# Patient Record
Sex: Male | Born: 2005 | Race: White | Hispanic: No | Marital: Single | Smoking: Former smoker
Health system: Southern US, Community
[De-identification: ages and names within clinical notes are randomized; demographics above are authoritative.]

## PROBLEM LIST (undated history)

## (undated) DIAGNOSIS — J210 Acute bronchiolitis due to respiratory syncytial virus: Secondary | ICD-10-CM

---
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
## 2023-11-11 ENCOUNTER — Emergency Department (HOSPITAL_BASED_OUTPATIENT_CLINIC_OR_DEPARTMENT_OTHER): Payer: 59 | Admitting: Radiology

## 2023-11-11 ENCOUNTER — Other Ambulatory Visit: Payer: Self-pay

## 2023-11-11 ENCOUNTER — Encounter (HOSPITAL_COMMUNITY): Payer: Self-pay

## 2023-11-11 ENCOUNTER — Encounter (HOSPITAL_BASED_OUTPATIENT_CLINIC_OR_DEPARTMENT_OTHER): Payer: Self-pay | Admitting: *Deleted

## 2023-11-11 ENCOUNTER — Emergency Department (HOSPITAL_BASED_OUTPATIENT_CLINIC_OR_DEPARTMENT_OTHER)
Admission: EM | Admit: 2023-11-11 | Discharge: 2023-11-11 | Disposition: A | Discharge status: Home / Self Care | Payer: 59 | Attending: Emergency Medicine | Admitting: Emergency Medicine

## 2023-11-11 DIAGNOSIS — R0789 Other chest pain: Secondary | ICD-10-CM | POA: Diagnosis present

## 2023-11-11 MED ORDER — ALUM & MAG HYDROXIDE-SIMETH 200-200-20 MG/5ML PO SUSP
30.0000 mL | Freq: Once | ORAL | Status: AC
  Administered 2023-11-11: 30 mL via ORAL
  Filled 2023-11-11: qty 30

## 2023-11-11 MED ORDER — LIDOCAINE VISCOUS HCL 2 % MT SOLN
15.0000 mL | Freq: Once | OROMUCOSAL | Status: AC
  Administered 2023-11-11: 15 mL via ORAL
  Filled 2023-11-11: qty 15

## 2023-11-11 NOTE — ED Notes (Addendum)
 Lab collection discontinued per Dr Eudelia Bunch at this time.
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
## 2023-11-11 NOTE — ED Provider Notes (Addendum)
 Travis Gordon EMERGENCY DEPARTMENT AT [facility] Provider Note  CSN: [id number] Arrival date & time: 11/11/23 9473  Chief Complaint(s) Chest Pain  HPI Travis Gordon is a 18 y.o. male here with substernal chest pain described as heartburn that began around 7 PM yesterday afternoon.  Initially mild and gradually worsened throughout the night.  His pain worsened, then became more of a pressure type pain which radiated to the left shoulder.  Patient had taken 2 doses of Pepcid Motrin .  Pain did not initially improve prompting a visit to the emergency department.  Pain has now significantly subsided and mild.  There was no alleviating or aggravating factors.  No associated shortness of breath.  No nausea or vomiting.  No abdominal pain.  No recent fevers or infections.  No coughing or congestion.  No recent prolonged immobilization or travel.  No prior history of DVT/PEs.  No personal history of cancer.  The history is provided by the patient.    Past Medical History History reviewed. No pertinent past medical history. There are no active problems to display for this patient.  Home Medication(s) Prior to Admission medications   Not on File                                                                                                                                    Allergies Patient has no known allergies.  Review of Systems Review of Systems As noted in HPI  Physical Exam Vital Signs  I have reviewed the triage vital signs BP 117/75 (BP Location: Right Arm)   Pulse 73   Temp 98.3 F (36.8 C)   Resp 17   SpO2 97%   Physical Exam Vitals reviewed.  Constitutional:      General: He is not in acute distress.    Appearance: He is well-developed. He is not diaphoretic.  HENT:     Head: Normocephalic and atraumatic.     Right Ear: External ear normal.     Left Ear: External ear normal.     Nose: Nose normal.     Mouth/Throat:     Mouth: Mucous membranes are moist.   Eyes:     General: No scleral icterus.       Right eye: No discharge.        Left eye: No discharge.     Conjunctiva/sclera: Conjunctivae normal.     Pupils: Pupils are equal, round, and reactive to light.  Neck:     Trachea: Phonation normal.  Cardiovascular:     Rate and Rhythm: Normal rate and regular rhythm.     Heart sounds: No murmur heard.    No friction rub. No gallop.  Pulmonary:     Effort: Pulmonary effort is normal. No respiratory distress.     Breath sounds: Normal breath sounds. No stridor. No rales.  Abdominal:     General: There is no distension.  Palpations: Abdomen is soft.     Tenderness: There is no abdominal tenderness.  Musculoskeletal:        General: No tenderness. Normal range of motion.     Cervical back: Normal range of motion and neck supple.  Skin:    General: Skin is warm and dry.     Findings: No erythema or rash.  Neurological:     Mental Status: He is alert and oriented to person, place, and time.  Psychiatric:        Behavior: Behavior normal.     ED Results and Treatments Labs (all labs ordered are listed, but only abnormal results are displayed) Labs Reviewed - No data to display                                                                                                                        EKG  EKG Interpretation Date/Time:  Friday November 11 2023 05:38:59 EST Ventricular Rate:  75 PR Interval:  184 QRS Duration:  96 QT Interval:  372 QTC Calculation: 415 R Axis:   77  Text Interpretation: Normal sinus rhythm Nonspecific T wave abnormality Abnormal ECG No previous ECGs available Confirmed by Trine Likes [phone]) on 11/11/2023 5:58:37 AM       Radiology DG Chest 2 View Result Date: 11/11/2023 CLINICAL DATA:  18 year old male with history of left-sided chest pain. EXAM: CHEST - 2 VIEW COMPARISON:  Chest x-ray 04/10/2021. FINDINGS: Lung volumes are normal. No consolidative airspace disease. No pleural effusions. No  pneumothorax. No pulmonary nodule or mass noted. Pulmonary vasculature and the cardiomediastinal silhouette are within normal limits. IMPRESSION: No radiographic evidence of acute cardiopulmonary disease. Electronically Signed   By: Toribio Aye M.D.   On: 11/11/2023 06:32    Medications Ordered in ED Medications  alum & mag hydroxide-simeth (MAALOX/MYLANTA) 200-200-20 MG/5ML suspension 30 mL (30 mLs Oral Given 11/11/23 9362)    And  lidocaine  (XYLOCAINE ) 2 % viscous mouth solution 15 mL (15 mLs Oral Given 11/11/23 9362)   Procedures Procedures  (including critical care time) Medical Decision Making / ED Course   Medical Decision Making Amount and/or Complexity of Data Reviewed Radiology: ordered and independent interpretation performed. Decision-making details documented in ED Course. ECG/medicine tests: ordered and independent interpretation performed. Decision-making details documented in ED Course.  Risk OTC drugs. Prescription drug management.    Chest pain differential considered Favored GI related etiology.  Completely resolved following a GI cocktail. Highly atypical for ACS.  EKG with nonspecific findings but no acute ischemic changes, dysrhythmias or blocks. Low pretest probability for pulmonary embolism and PERC negative. Presentation not classic for dissection or esophageal perforation Chest x-ray without evidence of pneumonia, pneumothorax, pulmonary edema pleural effusion.    Final Clinical Impression(s) / ED Diagnoses Final diagnoses:  Chest discomfort   The patient appears reasonably screened and/or stabilized for discharge and I doubt any other medical condition or other [facility] requiring further screening, evaluation, or treatment in the ED at this  time. I have discussed the findings, Dx and Tx plan with the patient/family who expressed understanding and agree(s) with the plan. Discharge instructions discussed at length. The patient/family was given strict return  precautions who verbalized understanding of the instructions. No further questions at time of discharge.  Disposition: Discharge  Condition: Good  ED Discharge Orders     None        Follow Up: Primary care provider  Call  to schedule an appointment for close follow up    This chart was dictated using voice recognition software.  Despite best efforts to proofread,  errors can occur which can change the documentation meaning.    Trine Raynell Moder, MD 11/11/23 [phone]

## 2023-11-11 NOTE — ED Notes (Signed)
 Pt given discharge instructions. Opportunities given for questions. Pt verbalizes understanding. Jillyn Hidden, RN

## 2023-11-11 NOTE — ED Notes (Signed)
 ED Provider at bedside.
